# Patient Record
Sex: Female | Born: 1985 | Race: White | Hispanic: No | Marital: Single | Smoking: Never smoker
Health system: Southern US, Community
[De-identification: ages and names within clinical notes are randomized; demographics above are authoritative.]

## PROBLEM LIST (undated history)

## (undated) DIAGNOSIS — N83201 Unspecified ovarian cyst, right side: Secondary | ICD-10-CM

## (undated) DIAGNOSIS — E119 Type 2 diabetes mellitus without complications: Secondary | ICD-10-CM

## (undated) DIAGNOSIS — E282 Polycystic ovarian syndrome: Secondary | ICD-10-CM

## (undated) DIAGNOSIS — E079 Disorder of thyroid, unspecified: Secondary | ICD-10-CM

## (undated) DIAGNOSIS — D219 Benign neoplasm of connective and other soft tissue, unspecified: Secondary | ICD-10-CM

## (undated) DIAGNOSIS — N809 Endometriosis, unspecified: Secondary | ICD-10-CM

## (undated) HISTORY — DX: Unspecified ovarian cyst, right side: N83.201

## (undated) HISTORY — DX: Benign neoplasm of connective and other soft tissue, unspecified: D21.9

## (undated) HISTORY — DX: Type 2 diabetes mellitus without complications: E11.9

## (undated) HISTORY — PX: APPENDECTOMY: SHX54

## (undated) HISTORY — DX: Endometriosis, unspecified: N80.9

## (undated) HISTORY — PX: CHOLECYSTECTOMY: SHX55

## (undated) HISTORY — DX: Disorder of thyroid, unspecified: E07.9

## (undated) HISTORY — PX: LAPAROSCOPIC OVARIAN CYSTECTOMY: SUR786

## (undated) HISTORY — DX: Polycystic ovarian syndrome: E28.2

---

## 2009-05-29 ENCOUNTER — Observation Stay: Payer: Self-pay | Admitting: Unknown Physician Specialty

## 2009-06-09 ENCOUNTER — Inpatient Hospital Stay: Payer: Self-pay

## 2011-06-14 ENCOUNTER — Ambulatory Visit: Payer: Self-pay | Admitting: General Practice

## 2011-06-22 ENCOUNTER — Ambulatory Visit: Payer: Self-pay | Admitting: General Practice

## 2011-09-28 ENCOUNTER — Other Ambulatory Visit: Payer: Self-pay | Admitting: Neurosurgery

## 2011-09-28 ENCOUNTER — Ambulatory Visit
Admission: RE | Admit: 2011-09-28 | Discharge: 2011-09-28 | Disposition: A | Discharge status: Home / Self Care | Payer: PRIVATE HEALTH INSURANCE | Source: Ambulatory Visit | Attending: Neurosurgery | Admitting: Neurosurgery

## 2011-09-28 DIAGNOSIS — D332 Benign neoplasm of brain, unspecified: Secondary | ICD-10-CM

## 2011-09-28 MED ORDER — GADOBENATE DIMEGLUMINE 529 MG/ML IV SOLN
19.0000 mL | Freq: Once | INTRAVENOUS | Status: AC | PRN
  Administered 2011-09-28: 19 mL via INTRAVENOUS

## 2016-11-19 DIAGNOSIS — E119 Type 2 diabetes mellitus without complications: Secondary | ICD-10-CM

## 2020-09-28 ENCOUNTER — Telehealth: Payer: Self-pay

## 2020-09-28 NOTE — Telephone Encounter (Signed)
Patient is scheduled for poss paraguard placement with [facility] on 10/01/20 at 3:10

## 2020-10-01 ENCOUNTER — Other Ambulatory Visit: Payer: Self-pay

## 2020-10-01 ENCOUNTER — Other Ambulatory Visit (HOSPITAL_COMMUNITY)
Admission: RE | Admit: 2020-10-01 | Discharge: 2020-10-01 | Disposition: A | Discharge status: Home / Self Care | Payer: 59 | Source: Ambulatory Visit | Attending: Obstetrics and Gynecology | Admitting: Obstetrics and Gynecology

## 2020-10-01 ENCOUNTER — Encounter: Payer: Self-pay | Admitting: Obstetrics and Gynecology

## 2020-10-01 ENCOUNTER — Ambulatory Visit (INDEPENDENT_AMBULATORY_CARE_PROVIDER_SITE_OTHER): Payer: 59 | Admitting: Obstetrics and Gynecology

## 2020-10-01 VITALS — BP 128/70 | Ht 69.0 in | Wt 215.2 lb

## 2020-10-01 DIAGNOSIS — Z124 Encounter for screening for malignant neoplasm of cervix: Secondary | ICD-10-CM | POA: Insufficient documentation

## 2020-10-01 DIAGNOSIS — Z113 Encounter for screening for infections with a predominantly sexual mode of transmission: Secondary | ICD-10-CM

## 2020-10-01 DIAGNOSIS — Z01419 Encounter for gynecological examination (general) (routine) without abnormal findings: Secondary | ICD-10-CM

## 2020-10-01 DIAGNOSIS — Z30430 Encounter for insertion of intrauterine contraceptive device: Secondary | ICD-10-CM

## 2020-10-01 DIAGNOSIS — Z1231 Encounter for screening mammogram for malignant neoplasm of breast: Secondary | ICD-10-CM | POA: Diagnosis not present

## 2020-10-01 LAB — POCT URINE PREGNANCY: Preg Test, Ur: NEGATIVE

## 2020-10-01 NOTE — Progress Notes (Signed)
Gynecology Annual Exam  PCP: Patient, No Pcp Per  Chief Complaint:  Chief Complaint  Patient presents with  . Gynecologic Exam    History of Present Illness: Patient is a 35 y.o. G2P0 presents for annual exam. The patient has no complaints today.   LMP: Patient's last menstrual period was 09/17/2020. Average Interval: monthly Duration of flow: 4-5 days Heavy Menses: sometimes Intermenstrual Bleeding: no Dysmenorrhea: sometiems    The patient does perform self breast exams.  There is no notable family history of breast or ovarian cancer in her family.  The patient reports her exercise generally consists of walking and HITT .  The patient denies current symptoms of depression.   Review of Systems: ROS  Past Medical History:  Past Medical History:  Diagnosis Date  . Diabetes mellitus without complication ([place])   . Endometriosis   . Fibroid   . Thyroid disease     Past Surgical History:  Past Surgical History:  Procedure Laterality Date  . APPENDECTOMY    . CHOLECYSTECTOMY      Gynecologic History:  Patient's last menstrual period was 09/17/2020. Menarche: 8th grade  History of fibroids, polyps, or ovarian cysts? : Yes, history of ovarian cyst  History of PCOS? has Hstory of Endometriosis? has History of abnormal pap smears? denies Have you had any sexually transmitted infections in the past?  denies    She has had HPV vaccination in the past.    Last Pap: Results were: unknown  She identifies as a female. She is sexually active with men.   She denies dyspareunia. She denies postcoital bleeding.  She currently uses condoms for contraception.    Obstetric History: G2P0  Family History:  History reviewed. No pertinent family history.  Social History:  Social History   Socioeconomic History  . Marital status: Married    Spouse name: Not on file  . Number of children: Not on file  . Years of education: Not on file  . Highest education level:  Not on file  Occupational History  . Not on file  Tobacco Use  . Smoking status: Never Smoker  . Smokeless tobacco: Never Used  Vaping Use  . Vaping Use: Never used  Substance and Sexual Activity  . Alcohol use: Yes    Comment: occ  . Drug use: Not on file  . Sexual activity: Yes  Other Topics Concern  . Not on file  Social History Narrative  . Not on file   Social Determinants of Health   Financial Resource Strain: Not on file  Food Insecurity: Not on file  Transportation Needs: Not on file  Physical Activity: Not on file  Stress: Not on file  Social Connections: Not on file  Intimate Partner Violence: Not on file    Allergies:  No Known Allergies  Medications: Prior to Admission medications   Not on File    Physical Exam Vitals: Blood pressure 128/70, height 5\' 9"  (1.753 m), weight 215 lb 3.2 oz (97.6 kg), last menstrual period 09/17/2020.  Physical Exam Constitutional:      Appearance: She is well-developed.  Genitourinary:     Vagina and uterus normal.     There is no lesion on the right labia.     There is no lesion on the left labia.    No lesions in the vagina.     Genitourinary Comments: External: Normal appearing vulva. No lesions noted.  Speculum examination: Normal appearing cervix. No blood in the vaginal vault. No  discharge.   Bimanual examination: Uterus midline, non-tender, normal in size, shape and contour.  No CMT. No adnexal masses. No adnexal tenderness. Pelvis not fixed.       Right Adnexa: no mass present.    Left Adnexa: no mass present.    No cervical motion tenderness.  Breasts:     Right: No inverted nipple, mass, nipple discharge or skin change.     Left: No inverted nipple, mass, nipple discharge or skin change.    HENT:     Head: Normocephalic and atraumatic.  Eyes:     Extraocular Movements: EOM normal.  Neck:     Thyroid: No thyromegaly.  Cardiovascular:     Rate and Rhythm: Normal rate and regular rhythm.     Heart  sounds: Normal heart sounds.  Pulmonary:     Effort: Pulmonary effort is normal.     Breath sounds: Normal breath sounds.  Abdominal:     General: Bowel sounds are normal. There is no distension.     Palpations: Abdomen is soft. There is no mass.  Musculoskeletal:     Cervical back: Neck supple.  Neurological:     Mental Status: She is alert and oriented to person, place, and time.  Skin:    General: Skin is warm and dry.  Psychiatric:        Mood and Affect: Mood and affect normal.        Behavior: Behavior normal.        Thought Content: Thought content normal.        Judgment: Judgment normal.  Vitals reviewed.      Female chaperone present for pelvic and breast  portions of the physical exam  IUD PROCEDURE NOTE:  HALSEY PERSAUD is a 35 y.o. G2P1002 here for IUD insertion. No GYN concerns.   IUD Insertion Procedure Note Patient identified, informed consent performed, consent signed.   Discussed risks of irregular bleeding, cramping, infection, malpositioning or misplacement of the IUD outside the uterus which may require further procedure such as laparoscopy, risk of failure <1%. Time out was performed.  Urine pregnancy test negative.  A bimanual exam showed the uterus to be anteverted.  Speculum placed in the vagina.  Cervix visualized.  Cleaned with Betadine x 2.  Grasped anteriorly with a single tooth tenaculum.  Uterus sounded to 10 cm.   Paragard IUD placed per manufacturer's recommendations.  Strings trimmed to 3 cm. Tenaculum was removed, good hemostasis noted.  Patient tolerated procedure well.   Patient was given post-procedure instructions.  She was advised to have backup contraception for one week.  Patient was also asked to follow up in 4 weeks for IUD check.  Assessment: 35 y.o. G2P0 routine annual exam  Plan: Problem List Items Addressed This Visit   None   Visit Diagnoses    Encounter for IUD insertion    -  Primary   Relevant Orders   POCT urine pregnancy  (Completed)   Encounter for annual routine gynecological examination       Breast cancer screening by mammogram       Encounter for gynecological examination without abnormal finding       Cervical cancer screening       Relevant Orders   Cytology - PAP   Screen for STD (sexually transmitted disease)       Relevant Orders   HIV antibody (with reflex)   Hepatitis panel, acute   RPR      1) STI screening was  offered and accepted  2) ASCCP guidelines and rational discussed.  Patient opts for every 5 years screening interval  3) Contraception - copper IUD inserted today  4) Routine healthcare maintenance including cholesterol, diabetes screening discussed managed by PCP   Follow up in 4 weeks for IUD check up  Adrian Prows MD, [place], [facility] 10/01/2020 3:49 PM

## 2020-10-01 NOTE — Progress Notes (Signed)
IUD Placement

## 2020-10-01 NOTE — Patient Instructions (Addendum)
Intrauterine Device Information An intrauterine device (IUD) is a medical device that is inserted into the uterus to prevent pregnancy. It is a small, T-shaped device that has one or two nylon strings hanging down from it. The strings hang out of the lower part of the uterus (cervix) to allow for future IUD removal. There are two types of IUDs:  Hormone IUD. This type of IUD is made of plastic and contains the hormone progestin (synthetic progesterone). A hormone IUD may last 3-5 years.  Copper IUD. This type of IUD has copper wire wrapped around it. A copper IUD may last up to 10 years. How is an IUD inserted? An IUD is inserted through the vagina, through the cervix, and into the uterus with a minor medical procedure. The procedure for IUD insertion may vary among health care providers and hospitals. How does an IUD work? Synthetic progesterone in a hormonal IUD prevents pregnancy by:  Thickening cervical mucus to prevent sperm from entering the uterus.  Thinning the uterine lining to prevent a fertilized egg from being implanted there. Copper in a copper IUD prevents pregnancy by making the uterus and fallopian tubes produce a fluid that kills sperm. What are the advantages of an IUD? Advantages of either type of IUD An IUD:  Is highly effective in preventing pregnancy.  Is reversible. You can become pregnant shortly after the IUD is removed.  Is low-maintenance and can stay in place for a long time.  Has no estrogen-related side effects.  Can be used when breastfeeding.  Is not associated with weight gain.  Can be inserted right after childbirth, an abortion, or a miscarriage. Advantages of a hormone IUD  If it is inserted within 7 days of your period starting, it works right after it has been inserted. If the hormone IUD is inserted at any other time in your cycle, you will need to use a backup method of birth control for 7 days after insertion.  It can make menstrual periods  lighter or stop completely.  It can reduce menstrual cramping and other discomforts from menstrual periods.  It can be used for 3-5 years, depending on which IUD you have. Advantages of a copper IUD  It works right after it is inserted.  It can be used as a form of emergency birth control if it is inserted within 5 days after having unprotected sex.  It does not interfere with your body's natural hormones.  It can be used for up to 10 years. What are the disadvantages of an IUD?  An IUD may cause irregular menstrual bleeding for a period of time after insertion.  It is common to have pain during insertion and have cramping and vaginal bleeding after insertion.  An IUD may cut the uterus (uterine perforation) when it is inserted. This is rare.  Pelvic inflammatory disease (PID) may happen after insertion of an IUD. PID is an infection in the uterus and fallopian tubes. The IUD does not cause the infection. The infection is usually from an unknown sexually transmitted infection (STI). This is rare, and it usually happens during the first 20 days after the IUD is inserted.  A copper IUD can make your menstrual flow heavier and more painful.  IUDs cannot prevent sexually transmitted infections (STIs). How is an IUD removed?   You will lie on your back with your knees bent and your feet in footrests (stirrups).  A device will be inserted into your vagina to spread apart the vaginal walls (  speculum). This will allow your health care provider to see the strings attached to the IUD.  Your health care provider will use a small instrument (forceps) to grasp the IUD strings and will pull firmly until the IUD is removed. You may have some discomfort when the IUD is removed. Your health care provider may recommend taking over-the-counter pain relievers, such as ibuprofen, before the procedure. You may also have minor spotting for a few days after the procedure. The procedure for IUD removal may  vary among health care providers and hospitals. Is an IUD right for me? If you are interested in an IUD, discuss it with your health care provider. He or she will make sure you are a good candidate for an IUD and will let you know more about the advantages, disadvantage, and possible side effects. This will allow you to make a decision about the device. Summary  An intrauterine device (IUD) is a medical device that is inserted in the uterus to prevent pregnancy. It is a small, T-shaped device that has one or two nylon strings hanging down from it.  A hormone IUD contains the hormone progestin (synthetic progesterone). A copper IUD has copper wire wrapped around it.  Synthetic progesterone in a hormone IUD prevents pregnancy by thickening cervical mucus and thinning the walls of the uterus. Copper in a copper IUD prevents pregnancy by making the uterus and fallopian tubes produce a fluid that kills sperm.  A hormone IUD can be left in place for 3-5 years. A copper IUD can be left in place for up to 10 years.  An IUD is inserted and removed by a health care provider. You may feel some pain during insertion and removal. Your health care provider may recommend taking over-the-counter pain medicine, such as ibuprofen, before an IUD procedure. This information is not intended to replace advice given to you by your health care provider. Make sure you discuss any questions you have with your health care provider. Document Revised: 03/03/2020 Document Reviewed: 03/03/2020 Elsevier Patient Education  2021 [place] for Calcium and Vitamin D  Age (yr) Calcium Recommended Dietary Allowance (mg/day) Vitamin D Recommended Dietary Allowance (international units/day)  9-18 1,300 600  19-50 1,000 600  51-70 1,200 600  71 and older 1,200 800  Data from [place] [facility]. Dietary reference intakes: calcium, vitamin D. [place], [place]:  [employer]; 2011.    Exercising to Stay Healthy To become healthy and stay healthy, it is recommended that you do moderate-intensity and vigorous-intensity exercise. You can tell that you are exercising at a moderate intensity if your heart starts beating faster and you start breathing faster but can still hold a conversation. You can tell that you are exercising at a vigorous intensity if you are breathing much harder and faster and cannot hold a conversation while exercising. Exercising regularly is important. It has many health benefits, such as:  Improving overall fitness, flexibility, and endurance.  Increasing bone density.  Helping with weight control.  Decreasing body fat.  Increasing muscle strength.  Reducing stress and tension.  Improving overall health. How often should I exercise? Choose an activity that you enjoy, and set realistic goals. Your health care provider can help you make an activity plan that works for you. Exercise regularly as told by your health care provider. This may include:  Doing strength training two times a week, such as: ? Lifting weights. ?  Using resistance bands. ? Push-ups. ? Sit-ups. ? Yoga.  Doing a certain intensity of exercise for a given amount of time. Choose from these options: ? A total of 150 minutes of moderate-intensity exercise every week. ? A total of 75 minutes of vigorous-intensity exercise every week. ? A mix of moderate-intensity and vigorous-intensity exercise every week. Children, pregnant women, people who have not exercised regularly, people who are overweight, and older adults may need to talk with a health care provider about what activities are safe to do. If you have a medical condition, be sure to talk with your health care provider before you start a new exercise program. What are some exercise ideas? Moderate-intensity exercise ideas include:  Walking 1 mile (1.6 km) in about 15  minutes.  Biking.  Hiking.  Golfing.  Dancing.  Water aerobics. Vigorous-intensity exercise ideas include:  Walking 4.5 miles (7.2 km) or more in about 1 hour.  Jogging or running 5 miles (8 km) in about 1 hour.  Biking 10 miles (16.1 km) or more in about 1 hour.  Lap swimming.  Roller-skating or in-line skating.  Cross-country skiing.  Vigorous competitive sports, such as football, basketball, and soccer.  Jumping rope.  Aerobic dancing.   What are some everyday activities that can help me to get exercise?  [place] work, such as: ? Pushing a Conservation officer, nature. ? Raking and bagging leaves.  Washing your car.  Pushing a stroller.  Shoveling snow.  Gardening.  Washing windows or floors. How can I be more active in my day-to-day activities?  Use stairs instead of an elevator.  Take a walk during your lunch break.  If you drive, park your car farther away from your work or school.  If you take public transportation, get off one stop early and walk the rest of the way.  Stand up or walk around during all of your indoor phone calls.  Get up, stretch, and walk around every 30 minutes throughout the day.  Enjoy exercise with a friend. Support to continue exercising will help you keep a regular routine of activity. What guidelines can I follow while exercising?  Before you start a new exercise program, talk with your health care provider.  Do not exercise so much that you hurt yourself, feel dizzy, or get very short of breath.  Wear comfortable clothes and wear shoes with good support.  Drink plenty of water while you exercise to prevent dehydration or heat stroke.  Work out until your breathing and your heartbeat get faster. Where to find more information  U.S. Department of Health and Human Services: Bonded[employer].at  Centers for Disease Control and Prevention (CDC): http://www.wolf.info/ Summary  Exercising regularly is important. It will improve your overall fitness,  flexibility, and endurance.  Regular exercise also will improve your overall health. It can help you control your weight, reduce stress, and improve your bone density.  Do not exercise so much that you hurt yourself, feel dizzy, or get very short of breath.  Before you start a new exercise program, talk with your health care provider. This information is not intended to replace advice given to you by your health care provider. Make sure you discuss any questions you have with your health care provider. Document Revised: 08/03/2017 Document Reviewed: 07/12/2017 Elsevier Patient Education  2021 [place].   Budget-Friendly Healthy Eating There are many ways to save money at the grocery store and continue to eat healthy. You can be successful if you:  Plan meals according  to your budget.  Make a grocery list and only purchase food according to your grocery list.  Prepare food yourself at home. What are tips for following this plan? Reading food labels  Compare food labels between brand name foods and the store brand. Often the nutritional value is the same, but the store brand is lower cost.  Look for products that do not have added sugar, fat, or salt (sodium). These often cost the same but are healthier for you. Products may be labeled as: ? Sugar-free. ? Nonfat. ? Low-fat. ? Sodium-free. ? Low-sodium.  Look for lean ground beef labeled as at least 92% lean and 8% fat. Shopping  Buy only the items on your grocery list and go only to the areas of the store that have the items on your list.  Use coupons only for foods and brands you normally buy. Avoid buying items you wouldn't normally buy simply because they are on sale.  Check online and in newspapers for weekly deals.  Buy healthy items from the bulk bins when available, such as herbs, spices, flour, pasta, nuts, and dried fruit.  Buy fruits and vegetables that are in season. Prices are usually lower on in-season  produce.  Look at the unit price on the price tag. Use it to compare different brands and sizes to find out which item is the best deal.  Choose healthy items that are often low-cost, such as carrots, potatoes, apples, bananas, and oranges. Dried or canned beans are a low-cost protein source.  Buy in bulk and freeze extra food. Items you can buy in bulk include meats, fish, poultry, frozen fruits, and frozen vegetables.  Avoid buying "ready-to-eat" foods, such as pre-cut fruits and vegetables and pre-made salads.  If possible, shop around to discover where you can find the best prices. Consider other retailers such as dollar stores, larger [employer], local fruit and vegetable stands, and farmers markets.  Do not shop when you are hungry. If you shop while hungry, it may be hard to stick to your list and budget.  Resist impulse buying. Use your grocery list as your official plan for the week.  Buy a variety of vegetables and fruits by purchasing fresh, frozen, and canned items.  Look at the top and bottom shelves for deals. Foods at eye level (eye level of an adult or child) are usually more expensive.  Be efficient with your time when shopping. The more time you spend at the store, the more money you are likely to spend.  To save money when choosing more expensive foods like meats and dairy: ? Choose cheaper cuts of meat, such as bone-in chicken thighs and drumsticks instead of skinless and boneless chicken. When you are ready to prepare the chicken, you can remove the skin yourself to make it healthier. ? Choose lean meats like chicken or [place] instead of beef. ? Choose canned seafood, such as tuna, salmon, or sardines. ? Buy eggs as a low-cost source of protein. ? Buy dried beans and peas, such as lentils, split peas, or kidney beans instead of meats. Dried beans and peas are a good alternative source of protein. ? Buy the larger tubs of yogurt instead of individual-sized  containers.  Choose water instead of sodas and other sweetened beverages.  Avoid buying chips, cookies, and other "junk food." These items are usually expensive and not healthy.   Cooking  Make extra food and freeze the extras in meal-sized containers or in individual portions for fast  meals and snacks.  Pre-cook on days when you have extra time to prepare meals in advance. You can keep these meals in the fridge or freezer and reheat for a quick meal.  When you come home from the grocery store, wash, peel, and cut fruits and vegetables so they are ready to use and eat. This will help reduce food waste. Meal planning  Do not eat out or get fast food. Prepare food at home.  Make a grocery list and make sure to bring it with you to the store. If you have a smart phone, you could use your phone to create your shopping list.  Plan meals and snacks according to a grocery list and budget you create.  Use leftovers in your meal plan for the week.  Look for recipes where you can cook once and make enough food for two meals.  Prepare budget-friendly types of meals like stews, casseroles, and stir-fry dishes.  Try some meatless meals or try "no cook" meals like salads.  Make sure that half your plate is filled with fruits or vegetables. Choose from fresh, frozen, or canned fruits and vegetables. If eating canned, remember to rinse them before eating. This will remove any excess salt added for packaging. Summary  Eating healthy on a budget is possible if you plan your meals according to your budget, purchase according to your budget and grocery list, and prepare food yourself.  Tips for buying more food on a limited budget include buying generic brands, using coupons only for foods you normally buy, and buying healthy items from the bulk bins when available.  Tips for buying cheaper food to replace expensive food include choosing cheaper, lean cuts of meat, and buying dried beans and  peas. This information is not intended to replace advice given to you by your health care provider. Make sure you discuss any questions you have with your health care provider. Document Revised: 06/03/2020 Document Reviewed: 06/03/2020 Elsevier Patient Education  [place] protect organs, store calcium, anchor muscles, and support the whole body. Keeping your bones strong is important, especially as you get older. You can take actions to help keep your bones strong and healthy. Why is keeping my bones healthy important? Keeping your bones healthy is important because your body constantly replaces bone cells. Cells get old, and new cells take their place. As we age, we lose bone cells because the body may not be able to make enough new cells to replace the old cells. The amount of bone cells and bone tissue you have is referred to as bone mass. The higher your bone mass, the stronger your bones. The aging process leads to an overall loss of bone mass in the body, which can increase the likelihood of:  Joint pain and stiffness.  Broken bones.  A condition in which the bones become weak and brittle (osteoporosis). A large decline in bone mass occurs in older adults. In women, it occurs about the time of menopause.   What actions can I take to keep my bones healthy? Good health habits are important for maintaining healthy bones. This includes eating nutritious foods and exercising regularly. To have healthy bones, you need to get enough of the right minerals and vitamins. Most nutrition experts recommend getting these nutrients from the foods that you eat. In some cases, taking supplements may also be recommended. Doing certain types of exercise is also important for bone health. What are the nutritional recommendations  for healthy bones? Eating a well-balanced diet with plenty of calcium and vitamin D will help to protect your bones. Nutritional recommendations vary  from person to person. Ask your health care provider what is healthy for you. Here are some general guidelines. Get enough calcium Calcium is the most important (essential) mineral for bone health. Most people can get enough calcium from their diet, but supplements may be recommended for people who are at risk for osteoporosis. Good sources of calcium include:  Dairy products, such as low-fat or nonfat milk, cheese, and yogurt.  Dark green leafy vegetables, such as bok choy and broccoli.  Calcium-fortified foods, such as orange juice, cereal, bread, soy beverages, and tofu products.  Nuts, such as almonds. Follow these recommended amounts for daily calcium intake:  Children, age 31-3: 700 mg.  Children, age 33-8: 1,000 mg.  Children, age 82-13: 1,300 mg.  Teens, age 22-18: 1,300 mg.  Adults, age 78-50: 1,000 mg.  Adults, age 24-70: ? Men: 1,000 mg. ? Women: 1,200 mg.  Adults, age 332 or older: 1,200 mg.  Pregnant and breastfeeding females: ? Teens: 1,300 mg. ? Adults: 1,000 mg. Get enough vitamin D Vitamin D is the most essential vitamin for bone health. It helps the body absorb calcium. Sunlight stimulates the skin to make vitamin D, so be sure to get enough sunlight. If you live in a cold climate or you do not get outside often, your health care provider may recommend that you take vitamin D supplements. Good sources of vitamin D in your diet include:  Egg yolks.  Saltwater fish.  Milk and cereal fortified with vitamin D. Follow these recommended amounts for daily vitamin D intake:  Children and teens, age 31-18: 600 international units.  Adults, age 66 or younger: 400-800 international units.  Adults, age 73 or older: 800-1,000 international units. Get other important nutrients Other nutrients that are important for bone health include:  Phosphorus. This mineral is found in meat, poultry, dairy foods, nuts, and legumes. The recommended daily intake for adult men and  adult women is 700 mg.  Magnesium. This mineral is found in seeds, nuts, dark green vegetables, and legumes. The recommended daily intake for adult men is 400-420 mg. For adult women, it is 310-320 mg.  Vitamin K. This vitamin is found in green leafy vegetables. The recommended daily intake is 120 mg for adult men and 90 mg for adult women.   What type of physical activity is best for building and maintaining healthy bones? Weight-bearing and strength-building activities are important for building and maintaining healthy bones. Weight-bearing activities cause muscles and bones to work against gravity. Strength-building activities increase the strength of the muscles that support bones. Weight-bearing and muscle-building activities include:  Walking and hiking.  Jogging and running.  Dancing.  Gym exercises.  Lifting weights.  Tennis and racquetball.  Climbing stairs.  Aerobics. Adults should get at least 30 minutes of moderate physical activity on most days. Children should get at least 60 minutes of moderate physical activity on most days. Ask your health care provider what type of exercise is best for you.   How can I find out if my bone mass is low? Bone mass can be measured with an X-ray test called a bone mineral density (BMD) test. This test is recommended for all women who are age 71 or older. It may also be recommended for:  Men who are age 32 or older.  People who are at risk for osteoporosis because of: ?  Having bones that break easily. ? Having a long-term disease that weakens bones, such as kidney disease or rheumatoid arthritis. ? Having menopause earlier than normal. ? Taking medicine that weakens bones, such as steroids, thyroid hormones, or hormone treatment for breast cancer or prostate cancer. ? Smoking. ? Drinking three or more alcoholic drinks a day. If you find that you have a low bone mass, you may be able to prevent osteoporosis or further bone loss by  changing your diet and lifestyle. Where can I find more information? For more information, check out the following websites:  [place]: AviationTales.fr  [employer] of Health: www.bones.SouthExposed.es  International Osteoporosis Foundation: Administrator.iofbonehealth.org Summary  The aging process leads to an overall loss of bone mass in the body, which can increase the likelihood of broken bones and osteoporosis.  Eating a well-balanced diet with plenty of calcium and vitamin D will help to protect your bones.  Weight-bearing and strength-building activities are also important for building and maintaining strong bones.  Bone mass can be measured with an X-ray test called a bone mineral density (BMD) test. This information is not intended to replace advice given to you by your health care provider. Make sure you discuss any questions you have with your health care provider. Document Revised: 09/17/2017 Document Reviewed: 09/17/2017 Elsevier Patient Education  2021 [employer].

## 2020-10-02 LAB — HEPATITIS PANEL, ACUTE
Hep A IgM: NEGATIVE
Hep B C IgM: NEGATIVE
Hep C Virus Ab: <0.1 s/co ratio (ref 0.0–0.9)
Hepatitis B Surface Ag: NEGATIVE

## 2020-10-02 LAB — RPR: RPR Ser Ql: NONREACTIVE

## 2020-10-02 LAB — HIV ANTIBODY (ROUTINE TESTING W REFLEX): HIV Screen 4th Generation wRfx: NONREACTIVE

## 2020-10-07 LAB — CYTOLOGY - PAP
Chlamydia: NEGATIVE
Comment: NEGATIVE
Comment: NEGATIVE
Comment: NEGATIVE
Comment: NORMAL
Diagnosis: UNDETERMINED — AB
High risk HPV: NEGATIVE
Neisseria Gonorrhea: NEGATIVE
Trichomonas: NEGATIVE

## 2020-10-07 NOTE — Telephone Encounter (Signed)
Paragard rcvd/charged 10/01/20

## 2020-10-26 ENCOUNTER — Telehealth: Payer: Self-pay

## 2020-10-26 NOTE — Telephone Encounter (Signed)
Pt calling; paragard was put in last month; has been bleeding quit a bit since Sunday; is taking iron b/c she isn't used to so much blood and clots.  [phone] Pt states she had some spotting with insertion; this is time for her period; states she is NOT saturating a pad every 42min-1hr.  Adv it takes a good three months for body to adjust to bc.  To give it some time; may continue iron if desires.

## 2021-04-20 ENCOUNTER — Telehealth: Payer: Self-pay

## 2021-04-20 ENCOUNTER — Ambulatory Visit (INDEPENDENT_AMBULATORY_CARE_PROVIDER_SITE_OTHER): Payer: 59

## 2021-04-20 ENCOUNTER — Other Ambulatory Visit: Payer: Self-pay

## 2021-04-20 DIAGNOSIS — R300 Dysuria: Secondary | ICD-10-CM | POA: Diagnosis not present

## 2021-04-20 LAB — POCT URINALYSIS DIPSTICK
Bilirubin, UA: NEGATIVE
Blood, UA: POSITIVE
Glucose, UA: NEGATIVE
Ketones, UA: NEGATIVE
Leukocytes, UA: NEGATIVE
Nitrite, UA: NEGATIVE
Protein, UA: NEGATIVE
Spec Grav, UA: 1.01 (ref 1.010–1.025)
Urobilinogen, UA: 0.2 E.U./dL
pH, UA: 5 (ref 5.0–8.0)

## 2021-04-20 NOTE — Telephone Encounter (Signed)
Patient is scheduled for 4:20 for UA drop

## 2021-04-20 NOTE — Telephone Encounter (Signed)
Please schedule her for a urine drop off nurse visit today. If UA is positive I will send a prescription.  Please schedule FUTURE office visit for IUD exchange if patient desires.   Thank you

## 2021-04-20 NOTE — Telephone Encounter (Signed)
Pt calling; had paragard inserted back in Jan b/c it was the only one available; her periods are heavier; the cramping is crazy; has PCOS and endometriosis; not sure if they could be contributing factors; had mirena before with no periods and never had these issues.  Take paragard and put in mirena?  Also, with her last period she had UTI sxs of back pain, frequency, painful urination; chills and body aches; she is having those sxs again with this period; can rx be sent in?  [phone]

## 2021-04-20 NOTE — Telephone Encounter (Signed)
Patient is requesting paraguard Removal and mirena placement with [facility] 05/04/21 at 3:30

## 2021-04-21 ENCOUNTER — Telehealth: Payer: Self-pay

## 2021-04-21 NOTE — Telephone Encounter (Signed)
Pt calling; dropped off urine yesterday; has it been tested yet?  [phone]  Pt aware specimen was sent for a culture; needs to grow for 48 hours; may be late tomorrow or Sat before we get the results; in meantime she can take AZO standard otc, drink cranberry juice if too sweet can mix half and half with ginger ale.

## 2021-04-22 LAB — URINE CULTURE: Organism ID, Bacteria: NO GROWTH

## 2021-05-04 ENCOUNTER — Other Ambulatory Visit: Payer: Self-pay

## 2021-05-04 ENCOUNTER — Ambulatory Visit (INDEPENDENT_AMBULATORY_CARE_PROVIDER_SITE_OTHER): Payer: 59 | Admitting: Obstetrics and Gynecology

## 2021-05-04 ENCOUNTER — Encounter: Payer: Self-pay | Admitting: Obstetrics and Gynecology

## 2021-05-04 VITALS — BP 110/70 | Ht 69.0 in | Wt 219.0 lb

## 2021-05-04 DIAGNOSIS — Z30433 Encounter for removal and reinsertion of intrauterine contraceptive device: Secondary | ICD-10-CM

## 2021-05-04 DIAGNOSIS — N946 Dysmenorrhea, unspecified: Secondary | ICD-10-CM

## 2021-05-04 MED ORDER — IBUPROFEN 800 MG PO TABS: 800.0000 mg | ORAL_TABLET | Freq: Three times a day (TID) | ORAL | 2 refills | Status: DC | PRN

## 2021-05-04 MED ORDER — LEVONORGESTREL 20 MCG/DAY IU IUD: 1.0000 | INTRAUTERINE_SYSTEM | Freq: Once | INTRAUTERINE | 0 refills | Status: AC

## 2021-05-04 NOTE — Progress Notes (Signed)
Chief Complaint  Patient presents with   Menorrhagia    Severe cramping since IUD insertion, having cycles every 15-20 days     History of Present Illness:  Diamond Hernandez is a 35 y.o. that had a Paragard IUD placed approximately 7 months ago. Since that time, she has had bleeding Q2-3 wks, lasting 6-7 days, heavy flow, with large clots and severe dysmen, not improved with ibup. Dysmen starts a few days before bleeding and similar to labor pains. Would like RF of ibup 800 mg. Hx of PCOS, ovar cysts, endometriosis. Had Mirena in past with amenorrhea and minimal dysmen. Would like another one. Only reason she got Paragard at 1/22 appt was that it was only one in stock that day. She is sex active, no pain/bleeding.  ASCUS/neg pap 1/22.   Past Medical History:  Diagnosis Date   Bilateral ovarian cysts    Diabetes mellitus without complication (HCC)    Endometriosis    Fibroid    PCOS (polycystic ovarian syndrome)    Thyroid disease    Past Surgical History:  Procedure Laterality Date   APPENDECTOMY     CHOLECYSTECTOMY     LAPAROSCOPIC OVARIAN CYSTECTOMY       BP 110/70   Ht '5\' 9"'$  (1.753 m)   Wt 219 lb (99.3 kg)   LMP 04/18/2021 (Exact Date)   BMI 32.34 kg/m   Pelvic exam:  Two IUD strings present seen coming from the cervical os. EGBUS, vaginal vault and cervix: within normal limits  IUD Removal Strings of IUD identified and grasped.  IUD removed without problem with ring forceps.  Pt tolerated this well.  IUD noted to be intact.   IUD Insertion Procedure Note Patient identified, informed consent performed, consent signed.   Discussed risks of irregular bleeding, cramping, infection, malpositioning or misplacement of the IUD outside the uterus which may require further procedure such as laparoscopy, risk of failure <1%. Time out was performed.    Speculum placed in the vagina.  Cervix visualized.  Cleaned with Betadine x 2.  Grasped anteriorly with a single tooth tenaculum.   Uterus sounded to 11.0 cm.   IUD placed per manufacturer's recommendations.  Strings trimmed to 3 cm. Tenaculum was removed, good hemostasis noted.  Patient tolerated procedure well.   ASSESSMENT:  Encounter for removal and reinsertion of intrauterine contraceptive device (IUD) - Plan: levonorgestrel (MIRENA) 20 MCG/DAY IUD  Dysmenorrhea - Plan: ibuprofen (ADVIL) 800 MG tablet.; Rx RF. Try Mirena. Will check GYN u/s if sx persist.   Hx of endometriosis, PCOS, ovar cysts.    Meds ordered this encounter  Medications   ibuprofen (ADVIL) 800 MG tablet    Sig: Take 1 tablet (800 mg total) by mouth 3 (three) times daily as needed.    Dispense:  30 tablet    Refill:  2    Order Specific Question:   Supervising Provider    Answer:   Gae Dry [id number]   levonorgestrel (MIRENA) 20 MCG/DAY IUD    Sig: 1 each by Intrauterine route once for 1 dose.    Dispense:  1 each    Refill:  0    Order Specific Question:   Supervising Provider    Answer:   Gae Dry [id number]     Plan:  Patient was given post-procedure instructions.  She was advised to have backup contraception for one week.   Call if you are having increasing pain, cramps or bleeding or if you have a  fever greater than 100.4 degrees F., shaking chills, nausea or vomiting. Patient was also asked to check IUD strings periodically and follow up in 4 weeks for IUD check.  Return in about 4 weeks (around 06/01/2021) for IUD f/u.  Meiko Stranahan B. Ladavia Lindenbaum, PA-C 05/04/2021 4:01 PM

## 2021-05-04 NOTE — Patient Instructions (Signed)
I value your feedback and you entrusting us with your care. If you get a [facility] patient survey, I would appreciate you taking the time to let us know about your experience today. Thank you!  [facility] [phone]  Instructions after IUD insertion  Most women experience no significant problems after insertion of an IUD, however minor cramping and spotting for a few days is common. Cramps may be treated with ibuprofen 800mg every 8 hours or Tylenol 650 mg every 4 hours. Contact [facility] immediately if you experience any of the following symptoms during the next week: temperature >99.6 degrees, worsening pelvic pain, abdominal pain, fainting, unusually heavy vaginal bleeding, foul vaginal discharge, or if you think you have expelled the IUD.  Nothing inserted in the vagina for 48 hours. You will be scheduled for a follow up visit in approximately four weeks.  You should check monthly to be sure you can feel the IUD strings in the upper vagina. If you are having a monthly period, try to check after each period. If you cannot feel the IUD strings,  contact [facility] immediately so we can do an exam to determine if the IUD has been expelled.   Please use backup protection until we can confirm the IUD is in place.  Call [facility] if you are exposed to or diagnosed with a sexually transmitted infection, as we will need to discuss whether it is safe for you to continue using an IUD.   

## 2021-05-05 NOTE — Telephone Encounter (Signed)
Noted. Mirena rcvd/charged 05/04/21

## 2021-05-31 NOTE — Progress Notes (Deleted)
   No chief complaint on file.    History of Present Illness:  Diamond Hernandez is a 35 y.o. that had a Mirena IUD placed approximately 1 month ago. Since that time, she denies dyspareunia, pelvic pain, non-menstrual bleeding, vaginal d/c, heavy bleeding.  Hx of PCOS, ovar cysts, endometriosis. Had Mirena in past with amenorrhea and minimal dysmen.    Review of Systems  Physical Exam:  There were no vitals taken for this visit. There is no height or weight on file to calculate BMI.  Pelvic exam:  Two IUD strings {DESC; PRESENT/ABSENT:17923} seen coming from the cervical os. EGBUS, vaginal vault and cervix: within normal limits   Assessment:   No diagnosis found.  IUD strings present in proper location; pt doing well  Plan: F/u if any signs of infection or can no longer feel the strings.   Tashaya Ancrum B. Jawanza Zambito, PA-C 05/31/2021 3:32 PM

## 2021-06-01 ENCOUNTER — Ambulatory Visit: Payer: 59 | Admitting: Obstetrics and Gynecology

## 2021-12-22 ENCOUNTER — Other Ambulatory Visit: Payer: Self-pay | Admitting: Nurse Practitioner

## 2021-12-22 DIAGNOSIS — K219 Gastro-esophageal reflux disease without esophagitis: Secondary | ICD-10-CM

## 2021-12-22 DIAGNOSIS — R140 Abdominal distension (gaseous): Secondary | ICD-10-CM

## 2022-01-06 ENCOUNTER — Ambulatory Visit
Admission: RE | Admit: 2022-01-06 | Discharge: 2022-01-06 | Disposition: A | Discharge status: Home / Self Care | Payer: 59 | Source: Ambulatory Visit | Attending: Nurse Practitioner | Admitting: Nurse Practitioner

## 2022-01-06 DIAGNOSIS — R140 Abdominal distension (gaseous): Secondary | ICD-10-CM | POA: Diagnosis present

## 2022-01-06 DIAGNOSIS — K219 Gastro-esophageal reflux disease without esophagitis: Secondary | ICD-10-CM | POA: Insufficient documentation

## 2022-01-06 IMAGING — NM NM GASTRIC EMPTYING
5 series · 16 of 16 positions shown · non-contrast
Comparison: None Available.

CLINICAL DATA: Gastroesophageal reflux.

EXAM:
NUCLEAR MEDICINE GASTRIC EMPTYING SCAN
TECHNIQUE: After oral ingestion of radiolabeled meal, sequential abdominal
images were obtained for 4 hours. Percentage of activity emptying
the stomach was calculated at 1 hour, 2 hour, 3 hour, and 4 hours.
RADIOPHARMACEUTICALS:  2.63 mCi [R3] sulfur colloid in
standardized meal

[Series 1000: gastric statics · 3.90mm/px · 2 of 2 frames shown (1 of 4)]
[frame 1/2]
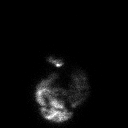
[frame 2/2]
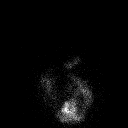

[Series 1000: gastric statics · 3.90mm/px · 2 of 2 frames shown (2 of 4)]
[frame 1/2]
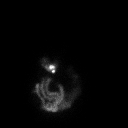
[frame 2/2]
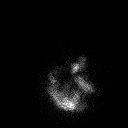

[Series 1000: gastric statics · 3.90mm/px · 2 of 2 frames shown (3 of 4)]
[frame 1/2]
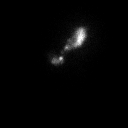
[frame 2/2]
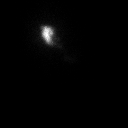

[Series 1000: gastric statics · 3.90mm/px · 2 of 2 frames shown (4 of 4)]
[frame 1/2]
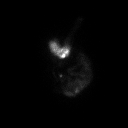
[frame 2/2]
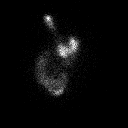

[Series 1000: gastric statics (results) · 3.90mm/px · 4 acquisitions, 8 frames shown]
[im 1/4]
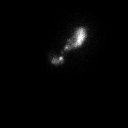
[im 1/4]
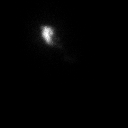
[im 2/4]
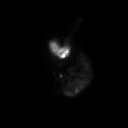
[im 2/4]
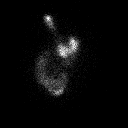
[im 3/4]
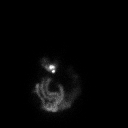
[im 3/4]
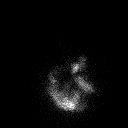
[im 4/4]
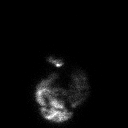
[im 4/4]
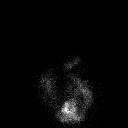

[16 of 16 positions shown; findings below may reference images not displayed]

FINDINGS: Expected location of the stomach in the left upper quadrant.
Ingested meal empties the stomach gradually over the course of the
study.

46% emptied at 1 hr ( normal >= 10%)

81% emptied at 2 hr ( normal >= 40%)

93% emptied at 3 hr ( normal >= 70%)
IMPRESSION: Normal gastric emptying study.

## 2022-01-06 MED ORDER — TECHNETIUM TC 99M SULFUR COLLOID
2.0000 | Freq: Once | INTRAVENOUS | Status: AC
  Administered 2022-01-06: 2.63 via ORAL

## 2022-03-21 ENCOUNTER — Encounter: Payer: Self-pay | Admitting: Family Medicine

## 2022-03-21 ENCOUNTER — Ambulatory Visit: Payer: 59 | Admitting: Family Medicine

## 2022-03-21 VITALS — BP 118/72 | HR 98 | Ht 69.0 in | Wt 221.0 lb

## 2022-03-21 DIAGNOSIS — Z8742 Personal history of other diseases of the female genital tract: Secondary | ICD-10-CM

## 2022-03-21 DIAGNOSIS — Z975 Presence of (intrauterine) contraceptive device: Secondary | ICD-10-CM | POA: Diagnosis not present

## 2022-03-21 DIAGNOSIS — N939 Abnormal uterine and vaginal bleeding, unspecified: Secondary | ICD-10-CM | POA: Diagnosis not present

## 2022-03-21 DIAGNOSIS — E282 Polycystic ovarian syndrome: Secondary | ICD-10-CM

## 2022-03-21 NOTE — Progress Notes (Signed)
   GYNECOLOGY PROBLEM  VISIT ENCOUNTER NOTE  Subjective:   Diamond Hernandez is a 36 y.o. G31P2002 female here for a problem GYN visit.  Current complaints:  Chief Complaint  Patient presents with   Gynecologic Exam    Check Mirena Placement? Had Paragard in past.  Has Mirena placed 04/2021. Has hashimotos,h/0 PCOS, Endometriosis, Ovarian Cyst. Still has spotting w/IUD getting progressively worse. Left side pelvic pain. Feels like when she has had cyst before. Requesting labs.   Reviewed TSH from [facility] system that was WNL in Feb Started Ozempic and has reports improved glucose control on that Feels right sided pain similar to prior ovarian cysts.   Had prior cysts removal on Right and also reports endometriosis on right.   Denies abnormal vaginal bleeding, discharge, pelvic pain, problems with intercourse or other gynecologic concerns.    Gynecologic History No LMP recorded. (Menstrual status: IUD).  Contraception: IUD  Health Maintenance Due  Topic Date Due   URINE MICROALBUMIN  Never done   TETANUS/TDAP  Never done   COVID-19 Vaccine (3 - Pfizer series) 02/02/2020    The following portions of the patient's history were reviewed and updated as appropriate: allergies, current medications, past family history, past medical history, past social history, past surgical history and problem list.  Review of Systems Pertinent items are noted in HPI.   Objective:  BP 118/72 (Patient Position: Sitting, Cuff Size: Normal)   Pulse 98   Ht '5\' 9"'$  (1.753 m)   Wt 221 lb (100.2 kg)   BMI 32.64 kg/m  Gen: well appearing, NAD HEENT: no scleral icterus CV: RR Lung: Normal WOB Ext: warm well perfused  PELVIC: Normal appearing external genitalia; normal appearing vaginal mucosa and cervix.  No abnormal discharge noted.  IUD strings seen.  Normal uterine size, no uterine tenderness. + left adnexal tenderness and fullness. No right adnexal fullness or pain.    Assessment and Plan:   1. Vaginal  bleeding - US PELVIC COMPLETE WITH TRANSVAGINAL; Future  2. Abnormal uterine bleeding (AUB) Likely related to IUD  Consider continuous OCP dosing for 3 months if [place] is WNL Pain and adnexal tenderness likely unrelated to AUB.  - US PELVIC COMPLETE WITH TRANSVAGINAL; Future  3. IUD (intrauterine device) in place LNG IUD  4. PCOS (polycystic ovarian syndrome) Patient reports being due to labs, I have reviewed her chart and it looks like PCP has done more lab but patient has not had PCOS monitoring labs recently Attempted to review prior labs that were monitored but they are not in [place].  Patient would benefit from testosterone level testing.  - TestT+TestF+SHBG; Future  5. H/O Endometriosis - Patient would benefit from discussion about management of endometriosis after [place] results - Could consider GnRH vs Orlissa therapy  Please refer to After Visit Summary for other counseling recommendations.   Return in about 4 weeks (around 04/18/2022) for Justice Britain MD to disucss [place] and possible surgery.  Future Appointments  Date Time Provider [place]  03/23/2022 10:20 AM [facility] None     Caren Macadam, MD, MPH, ABFM Attending [place] for [facility]

## 2022-03-23 ENCOUNTER — Other Ambulatory Visit: Payer: 59

## 2022-03-23 DIAGNOSIS — E282 Polycystic ovarian syndrome: Secondary | ICD-10-CM

## 2022-03-29 LAB — TESTT+TESTF+SHBG
Sex Hormone Binding: 56.8 nmol/L (ref 24.6–122.0)
Testosterone, Free: 0.6 pg/mL (ref 0.0–4.2)
Testosterone, Total, LC/MS: 16.1 ng/dL (ref 10.0–55.0)

## 2022-05-27 ENCOUNTER — Other Ambulatory Visit: Payer: Self-pay | Admitting: Obstetrics and Gynecology

## 2022-05-27 DIAGNOSIS — N946 Dysmenorrhea, unspecified: Secondary | ICD-10-CM

## 2022-06-23 ENCOUNTER — Ambulatory Visit
Admission: RE | Admit: 2022-06-23 | Discharge: 2022-06-23 | Disposition: A | Discharge status: Home / Self Care | Payer: 59 | Source: Ambulatory Visit | Attending: Orthopedic Surgery | Admitting: Orthopedic Surgery

## 2022-06-23 ENCOUNTER — Other Ambulatory Visit: Payer: Self-pay | Admitting: Orthopedic Surgery

## 2022-06-23 DIAGNOSIS — R600 Localized edema: Secondary | ICD-10-CM | POA: Diagnosis present

## 2022-06-23 DIAGNOSIS — R2241 Localized swelling, mass and lump, right lower limb: Secondary | ICD-10-CM | POA: Insufficient documentation

## 2022-12-06 ENCOUNTER — Telehealth: Payer: Self-pay

## 2022-12-06 NOTE — Telephone Encounter (Signed)
Courtesy call placed to NP. No answer, VM left with appt details and clinic location. CB # left for any questions.

## 2022-12-07 ENCOUNTER — Inpatient Hospital Stay: Payer: 59

## 2022-12-07 ENCOUNTER — Inpatient Hospital Stay: Payer: 59 | Attending: Oncology | Admitting: Oncology

## 2022-12-07 ENCOUNTER — Encounter: Payer: Self-pay | Admitting: Oncology

## 2022-12-07 VITALS — BP 119/78 | HR 78 | Temp 97.3°F | Resp 18 | Ht 70.0 in | Wt 208.9 lb

## 2022-12-07 DIAGNOSIS — D509 Iron deficiency anemia, unspecified: Secondary | ICD-10-CM

## 2022-12-07 DIAGNOSIS — R5383 Other fatigue: Secondary | ICD-10-CM | POA: Insufficient documentation

## 2022-12-07 DIAGNOSIS — K921 Melena: Secondary | ICD-10-CM | POA: Diagnosis not present

## 2022-12-07 DIAGNOSIS — E611 Iron deficiency: Secondary | ICD-10-CM | POA: Insufficient documentation

## 2022-12-07 LAB — CBC WITH DIFFERENTIAL/PLATELET
Abs Immature Granulocytes: 0.01 10*3/uL (ref 0.00–0.07)
Basophils Absolute: 0 10*3/uL (ref 0.0–0.1)
Basophils Relative: 1 %
Eosinophils Absolute: 0.1 10*3/uL (ref 0.0–0.5)
Eosinophils Relative: 2 %
HCT: 44.3 % (ref 36.0–46.0)
Hemoglobin: 15 g/dL (ref 12.0–15.0)
Immature Granulocytes: 0 %
Lymphocytes Relative: 37 %
Lymphs Abs: 2.8 10*3/uL (ref 0.7–4.0)
MCH: 28.3 pg (ref 26.0–34.0)
MCHC: 33.9 g/dL (ref 30.0–36.0)
MCV: 83.6 fL (ref 80.0–100.0)
Monocytes Absolute: 0.5 10*3/uL (ref 0.1–1.0)
Monocytes Relative: 7 %
Neutro Abs: 4.2 10*3/uL (ref 1.7–7.7)
Neutrophils Relative %: 53 %
Platelets: 289 10*3/uL (ref 150–400)
RBC: 5.3 MIL/uL — ABNORMAL HIGH (ref 3.87–5.11)
RDW: 13.3 % (ref 11.5–15.5)
WBC: 7.7 10*3/uL (ref 4.0–10.5)
nRBC: 0 % (ref 0.0–0.2)

## 2022-12-07 LAB — RETIC PANEL
Immature Retic Fract: 5.2 % (ref 2.3–15.9)
RBC.: 5.23 MIL/uL — ABNORMAL HIGH (ref 3.87–5.11)
Retic Count, Absolute: 64.3 10*3/uL (ref 19.0–186.0)
Retic Ct Pct: 1.2 % (ref 0.4–3.1)
Reticulocyte Hemoglobin: 32.2 pg (ref >27.9)

## 2022-12-07 LAB — IRON AND TIBC
Iron: 62 ug/dL (ref 28–170)
Saturation Ratios: 16 % (ref 10.4–31.8)
TIBC: 388 ug/dL (ref 250–450)
UIBC: 326 ug/dL

## 2022-12-07 LAB — FERRITIN: Ferritin: 14 ng/mL (ref 11–307)

## 2022-12-07 NOTE — Assessment & Plan Note (Addendum)
Fatigue not explained by iron deficiency as hemoglobin levels are normal. Differential diagnosis: Possible, thyroid disorder, mood disorders, or sleep apnea contribution, though patient reports feeling refreshed in the morning and uses CPAP machine effectively. Pending above work up

## 2022-12-07 NOTE — Progress Notes (Signed)
Hematology/Oncology Consult note [phone] [phone]      Patient Care Team: Donnamarie Rossetti, PA-C as PCP - General (Family Medicine)   REFERRING PROVIDER: Grayland Ormond Hestle,*  CHIEF COMPLAINTS/REASON FOR VISIT:  Low ferritin   ASSESSMENT & PLAN:  Iron deficiency Iron Deficiency, persistent low iron levels without anemia; hemoglobin level is normal at 14. Repeat cbc, iron tibc ferritin, retic panel, smear, epo, Jak2 mutation with reflex, testosterone level  She may try different oral iron formulations. IV iron supplementation deemed unnecessary at this time due to normal hemoglobin levels and potential risks.   Fatigue Fatigue not explained by iron deficiency as hemoglobin levels are normal. Differential diagnosis: Possible, thyroid disorder, mood disorders, or sleep apnea contribution, though patient reports feeling refreshed in the morning and uses CPAP machine effectively. Pending above work up  Orders Placed This Encounter  Procedures   Ferritin    Standing Status:   Future    Number of Occurrences:   1    Standing Expiration Date:   06/08/2023   Iron and TIBC    Standing Status:   Future    Number of Occurrences:   1    Standing Expiration Date:   12/07/2023   Retic Panel    Standing Status:   Future    Number of Occurrences:   1    Standing Expiration Date:   12/07/2023   CBC with Differential/Platelet    Standing Status:   Future    Number of Occurrences:   1    Standing Expiration Date:   12/07/2023   JAK2 V617F rfx CALR/MPL/E12-15    Standing Status:   Future    Number of Occurrences:   1    Standing Expiration Date:   12/07/2023   Testosterone    Standing Status:   Future    Number of Occurrences:   1    Standing Expiration Date:   12/07/2023   Technologist smear review    Standing Status:   Future    Number of Occurrences:   1    Standing Expiration Date:   12/07/2023    Order Specific Question:   Clinical information:     Answer:   iron deficiency   Erythropoietin    Standing Status:   Future    Number of Occurrences:   1    Standing Expiration Date:   12/07/2023   Carbon Monoxide, Blood    Standing Status:   Future    Number of Occurrences:   1    Standing Expiration Date:   12/07/2023   Follow up in 3-4 weeks.  All questions were answered. The patient knows to call the clinic with any problems, questions or concerns.  Earlie Server, MD, PhD [facility] Hematology Oncology 12/07/2022     HISTORY OF PRESENTING ILLNESS:  Diamond Hernandez is a  37 y.o.  female with PMH listed below who was referred to me for low ferritin  Reviewed patient's recent labs that was done.   Patient reports a history of low iron levels for "probably a little over a year." Recent blood work showed 11/28/22 Iron saturation 12, ferritin 8, she has normal hemoglobin of 14, MCV 83.5 Review previous blood work. 10/27/21 Hb 12.1, MCV 74.6, 12/27/21 ferritin 4, iron saturation 5% - Experiences nausea from oral iron supplementation: "I take the iron, and I hate the iron. It makes me so sick." - Reports occasional blood in stool but not consistently: "Sometimes. Not always, though."  She reports normal EGD and Colonoscopy, results are not available to me in EMR - Denies smoking cigarettes and has sleep apnea, for which she uses CPAP machine. Reports being complaint with CPAP - Expresses feeling tired despite normal hemoglobin levels:     MEDICAL HISTORY:  Past Medical History:  Diagnosis Date   Bilateral ovarian cysts    Diabetes mellitus without complication    Endometriosis    Fibroid    PCOS (polycystic ovarian syndrome)    Thyroid disease     SURGICAL HISTORY: Past Surgical History:  Procedure Laterality Date   APPENDECTOMY     CHOLECYSTECTOMY     LAPAROSCOPIC OVARIAN CYSTECTOMY      SOCIAL HISTORY: Social History   Socioeconomic History   Marital status: Single    Spouse name: Not on file   Number of children: Not on file    Years of education: Not on file   Highest education level: Not on file  Occupational History   Not on file  Tobacco Use   Smoking status: Never   Smokeless tobacco: Never  Vaping Use   Vaping Use: Never used  Substance and Sexual Activity   Alcohol use: Not Currently    Comment: occ   Drug use: Never   Sexual activity: Yes    Birth control/protection: I.U.D.    Comment: Paragard  Other Topics Concern   Not on file  Social History Narrative   Not on file   Social Determinants of Health   Financial Resource Strain: Not on file  Food Insecurity: No Food Insecurity (12/07/2022)   Hunger Vital Sign    Worried About Running Out of Food in the Last Year: Never true    Ran Out of Food in the Last Year: Never true  Transportation Needs: No Transportation Needs (12/07/2022)   PRAPARE - Hydrologist (Medical): No    Lack of Transportation (Non-Medical): No  Physical Activity: Not on file  Stress: Not on file  Social Connections: Not on file  Intimate Partner Violence: Not At Risk (12/07/2022)   Humiliation, Afraid, Rape, and Kick questionnaire    Fear of Current or Ex-Partner: No    Emotionally Abused: No    Physically Abused: No    Sexually Abused: No    FAMILY HISTORY: Family History  Problem Relation Age of Onset   Hashimoto's thyroiditis Mother    Diabetes Father    Colon cancer Maternal Grandmother    Colon cancer Paternal Grandmother     ALLERGIES:  is allergic to ondansetron.  MEDICATIONS:  Current Outpatient Medications  Medication Sig Dispense Refill   Cholecalciferol 50 MCG (2000 UT) TABS Take by mouth.     colestipol (COLESTID) 1 g tablet Take 2 g by mouth 2 (two) times daily.     Continuous Blood Gluc Receiver (FREESTYLE LIBRE 14 DAY READER) DEVI      Continuous Blood Gluc Sensor (FREESTYLE LIBRE 14 DAY SENSOR) MISC SMARTSIG:1 Each Topical Every 2 Weeks     EUTHYROX 75 MCG tablet Take 75 mcg by mouth every morning.     ibuprofen  (ADVIL) 800 MG tablet TAKE 1 TABLET BY MOUTH THREE TIMES A DAY AS NEEDED 30 tablet 1   insulin glargine (LANTUS) 100 UNIT/ML injection INJECT UP TO 100 UNITS SUBCUTANEOUSLY DAILY     levonorgestrel (MIRENA) 20 MCG/DAY IUD 1 each by Intrauterine route once for 1 dose. 1 each 0   levothyroxine (SYNTHROID) 75 MCG tablet Take by mouth.  MOUNJARO 10 MG/0.5ML Pen Inject 10 mg into the skin once a week.     omeprazole (PRILOSEC) 40 MG capsule Take 40 mg by mouth daily.     No current facility-administered medications for this visit.    Review of Systems  Constitutional:  Positive for fatigue. Negative for appetite change, chills and fever.  HENT:   Negative for hearing loss and voice change.   Eyes:  Negative for eye problems.  Respiratory:  Negative for chest tightness and cough.   Cardiovascular:  Negative for chest pain.  Gastrointestinal:  Negative for abdominal distention, abdominal pain and blood in stool.  Endocrine: Negative for hot flashes.  Genitourinary:  Negative for difficulty urinating and frequency.   Musculoskeletal:  Negative for arthralgias.  Skin:  Negative for itching and rash.  Neurological:  Negative for extremity weakness.  Hematological:  Negative for adenopathy.  Psychiatric/Behavioral:  Negative for confusion.     PHYSICAL EXAMINATION: Vitals:   12/07/22 1553  BP: 119/78  Pulse: 78  Resp: 18  Temp: (!) 97.3 F (36.3 C)   Filed Weights   12/07/22 1553  Weight: 208 lb 14.4 oz (94.8 kg)    Physical Exam Constitutional:      General: She is not in acute distress. HENT:     Head: Normocephalic and atraumatic.  Eyes:     General: No scleral icterus. Cardiovascular:     Rate and Rhythm: Normal rate and regular rhythm.     Heart sounds: Normal heart sounds.  Pulmonary:     Effort: Pulmonary effort is normal. No respiratory distress.     Breath sounds: No wheezing.  Abdominal:     General: Bowel sounds are normal. There is no distension.      Palpations: Abdomen is soft.  Musculoskeletal:        General: No deformity. Normal range of motion.     Cervical back: Normal range of motion and neck supple.  Skin:    General: Skin is warm and dry.     Findings: No erythema or rash.  Neurological:     Mental Status: She is alert and oriented to person, place, and time. Mental status is at baseline.     Cranial Nerves: No cranial nerve deficit.     Coordination: Coordination normal.  Psychiatric:        Mood and Affect: Mood normal.      LABORATORY DATA:  I have reviewed the data as listed    Latest Ref Rng & Units 12/07/2022    4:29 PM  CBC  WBC 4.0 - 10.5 K/uL 7.7   Hemoglobin 12.0 - 15.0 g/dL 15.0   Hematocrit 36.0 - 46.0 % 44.3   Platelets 150 - 400 K/uL 289        No data to display            Component Value Date/Time   IRON 62 12/07/2022 1629   TIBC 388 12/07/2022 1629   FERRITIN 14 12/07/2022 1629   IRONPCTSAT 16 12/07/2022 1629     RADIOGRAPHIC STUDIES: I have personally reviewed the radiological images as listed and agreed with the findings in the report. No results found.

## 2022-12-07 NOTE — Assessment & Plan Note (Addendum)
Iron Deficiency, persistent low iron levels without anemia; hemoglobin level is normal at 14. Repeat cbc, iron tibc ferritin, retic panel, smear, epo, Jak2 mutation with reflex, testosterone level  She may try different oral iron formulations. IV iron supplementation deemed unnecessary at this time due to normal hemoglobin levels and potential risks.

## 2022-12-08 LAB — TECHNOLOGIST SMEAR REVIEW

## 2022-12-08 LAB — TESTOSTERONE: Testosterone: 24 ng/dL (ref 8–60)

## 2022-12-08 LAB — CARBON MONOXIDE, BLOOD (PERFORMED AT REF LAB): Carbon Monoxide, Blood: 2.1 % (ref 0.0–3.6)

## 2022-12-08 LAB — ERYTHROPOIETIN: Erythropoietin: 10.4 m[IU]/mL (ref 2.6–18.5)

## 2022-12-11 ENCOUNTER — Encounter: Payer: Self-pay | Admitting: Oncology

## 2022-12-19 LAB — JAK2 V617F RFX CALR/MPL/E12-15

## 2022-12-19 LAB — CALR +MPL + E12-E15  (REFLEX)

## 2023-01-08 ENCOUNTER — Ambulatory Visit: Payer: 59 | Admitting: Oncology

## 2023-01-18 ENCOUNTER — Encounter: Payer: Self-pay | Admitting: Oncology

## 2023-01-18 ENCOUNTER — Inpatient Hospital Stay: Payer: 59 | Attending: Oncology | Admitting: Oncology

## 2023-01-18 VITALS — BP 116/75 | HR 84 | Temp 97.8°F | Resp 18 | Wt 203.7 lb

## 2023-01-18 DIAGNOSIS — D509 Iron deficiency anemia, unspecified: Secondary | ICD-10-CM

## 2023-01-18 DIAGNOSIS — R5383 Other fatigue: Secondary | ICD-10-CM

## 2023-01-18 DIAGNOSIS — E611 Iron deficiency: Secondary | ICD-10-CM | POA: Insufficient documentation

## 2023-01-18 NOTE — Assessment & Plan Note (Addendum)
Fatigue not explained by iron deficiency as hemoglobin levels are normal. Differential diagnosis: Possible, thyroid disorder, mood disorders, or sleep apnea contribution, though patient reports feeling refreshed in the morning and uses CPAP machine effectively. Check vitamin D level

## 2023-01-18 NOTE — Progress Notes (Signed)
Hematology/Oncology Consult note [phone] [phone]      Patient Care Team: Wilford Corner, PA-C as PCP - General (Family Medicine)   REFERRING PROVIDER: Debbra Riding Hestle,*  CHIEF COMPLAINTS/REASON FOR VISIT:  Low ferritin   ASSESSMENT & PLAN:  Iron deficiency Iron Deficiency, persistent low iron levels without anemia; hemoglobin level is normal at 15 Repeat testing showed ferritin of 14, iron saturation 16 within normal limits, low normal end.  Other work up negative for myeloproliferative disorders.  Recommend patient to continue oral iron supplementation daily. Repeat levels in 3 months.    Fatigue Fatigue not explained by iron deficiency as hemoglobin levels are normal. Differential diagnosis: Possible, thyroid disorder, mood disorders, or sleep apnea contribution, though patient reports feeling refreshed in the morning and uses CPAP machine effectively. Check vitamin D level   Orders Placed This Encounter  Procedures   CBC with Differential (Cancer Center Only)    Standing Status:   Future    Standing Expiration Date:   01/18/2024   Iron and TIBC    Standing Status:   Future    Standing Expiration Date:   01/18/2024   Ferritin    Standing Status:   Future    Standing Expiration Date:   01/18/2024   VITAMIN D 25 Hydroxy (Vit-D Deficiency, Fractures)    Standing Status:   Future    Standing Expiration Date:   01/18/2024   Retic Panel    Standing Status:   Future    Standing Expiration Date:   01/18/2024   Follow up in 3 months All questions were answered. The patient knows to call the clinic with any problems, questions or concerns.  Rickard Patience, MD, PhD [facility] Hematology Oncology 01/18/2023     HISTORY OF PRESENTING ILLNESS:  Diamond Hernandez is a  37 y.o.  female with PMH listed below who was referred to me for low ferritin  Reviewed patient's recent labs that was done.   Patient reports a history of low iron levels for  "probably a little over a year." Recent blood work showed 11/28/22 Iron saturation 12, ferritin 8, she has normal hemoglobin of 14, MCV 83.5 Review previous blood work. 10/27/21 Hb 12.1, MCV 74.6, 12/27/21 ferritin 4, iron saturation 5% - Experiences nausea from oral iron supplementation: "I take the iron, and I hate the iron. It makes me so sick." - Reports occasional blood in stool but not consistently: "Sometimes. Not always, though." She reports normal EGD and Colonoscopy, results are not available to me in EMR - Denies smoking cigarettes and has sleep apnea, for which she uses CPAP machine. Reports being complaint with CPAP - Expresses feeling tired despite normal hemoglobin levels:   INTERVAL HISTORY Diamond Hernandez is a 37 y.o. female who has above history reviewed by me today presents for follow up visit for  Low ferritin No new complaints. Chronically fatigued.   MEDICAL HISTORY:  Past Medical History:  Diagnosis Date   Bilateral ovarian cysts    Diabetes mellitus without complication (HCC)    Endometriosis    Fibroid    PCOS (polycystic ovarian syndrome)    Thyroid disease     SURGICAL HISTORY: Past Surgical History:  Procedure Laterality Date   APPENDECTOMY     CHOLECYSTECTOMY     LAPAROSCOPIC OVARIAN CYSTECTOMY      SOCIAL HISTORY: Social History   Socioeconomic History   Marital status: Single    Spouse name: Not on file   Number of children:  Not on file   Years of education: Not on file   Highest education level: Not on file  Occupational History   Not on file  Tobacco Use   Smoking status: Never   Smokeless tobacco: Never  Vaping Use   Vaping Use: Never used  Substance and Sexual Activity   Alcohol use: Not Currently    Comment: occ   Drug use: Never   Sexual activity: Yes    Birth control/protection: I.U.D.    Comment: Paragard  Other Topics Concern   Not on file  Social History Narrative   Not on file   Social Determinants of Health   Financial  Resource Strain: Not on file  Food Insecurity: No Food Insecurity (12/07/2022)   Hunger Vital Sign    Worried About Running Out of Food in the Last Year: Never true    Ran Out of Food in the Last Year: Never true  Transportation Needs: No Transportation Needs (12/07/2022)   PRAPARE - Administrator, Civil Service (Medical): No    Lack of Transportation (Non-Medical): No  Physical Activity: Not on file  Stress: Not on file  Social Connections: Not on file  Intimate Partner Violence: Not At Risk (12/07/2022)   Humiliation, Afraid, Rape, and Kick questionnaire    Fear of Current or Ex-Partner: No    Emotionally Abused: No    Physically Abused: No    Sexually Abused: No    FAMILY HISTORY: Family History  Problem Relation Age of Onset   Hashimoto's thyroiditis Mother    Diabetes Father    Colon cancer Maternal Grandmother    Colon cancer Paternal Grandmother     ALLERGIES:  is allergic to ondansetron.  MEDICATIONS:  Current Outpatient Medications  Medication Sig Dispense Refill   Cholecalciferol 50 MCG (2000 UT) TABS Take by mouth.     colestipol (COLESTID) 1 g tablet Take 2 g by mouth 2 (two) times daily.     Continuous Blood Gluc Receiver (FREESTYLE LIBRE 14 DAY READER) DEVI      Continuous Blood Gluc Sensor (FREESTYLE LIBRE 14 DAY SENSOR) MISC SMARTSIG:1 Each Topical Every 2 Weeks     ibuprofen (ADVIL) 800 MG tablet TAKE 1 TABLET BY MOUTH THREE TIMES A DAY AS NEEDED 30 tablet 1   insulin glargine (LANTUS) 100 UNIT/ML injection INJECT UP TO 100 UNITS SUBCUTANEOUSLY DAILY     levonorgestrel (MIRENA) 20 MCG/DAY IUD 1 each by Intrauterine route once for 1 dose. 1 each 0   levothyroxine (SYNTHROID) 75 MCG tablet Take by mouth.     MOUNJARO 10 MG/0.5ML Pen Inject 10 mg into the skin once a week.     omeprazole (PRILOSEC) 40 MG capsule Take 40 mg by mouth daily.     EUTHYROX 75 MCG tablet Take 75 mcg by mouth every morning.     No current facility-administered medications  for this visit.    Review of Systems  Constitutional:  Positive for fatigue. Negative for appetite change, chills and fever.  HENT:   Negative for hearing loss and voice change.   Eyes:  Negative for eye problems.  Respiratory:  Negative for chest tightness and cough.   Cardiovascular:  Negative for chest pain.  Gastrointestinal:  Negative for abdominal distention, abdominal pain and blood in stool.  Endocrine: Negative for hot flashes.  Genitourinary:  Negative for difficulty urinating and frequency.   Musculoskeletal:  Negative for arthralgias.  Skin:  Negative for itching and rash.  Neurological:  Negative for  extremity weakness.  Hematological:  Negative for adenopathy.  Psychiatric/Behavioral:  Negative for confusion.     PHYSICAL EXAMINATION: Vitals:   01/18/23 1446  BP: 116/75  Pulse: 84  Resp: 18  Temp: 97.8 F (36.6 C)   Filed Weights   01/18/23 1446  Weight: 203 lb 11.2 oz (92.4 kg)    Physical Exam Constitutional:      General: She is not in acute distress. HENT:     Head: Normocephalic and atraumatic.  Eyes:     General: No scleral icterus. Cardiovascular:     Rate and Rhythm: Normal rate and regular rhythm.     Heart sounds: Normal heart sounds.  Pulmonary:     Effort: Pulmonary effort is normal. No respiratory distress.     Breath sounds: No wheezing.  Abdominal:     General: Bowel sounds are normal. There is no distension.     Palpations: Abdomen is soft.  Musculoskeletal:        General: No deformity. Normal range of motion.     Cervical back: Normal range of motion and neck supple.  Skin:    General: Skin is warm and dry.     Findings: No erythema or rash.  Neurological:     Mental Status: She is alert and oriented to person, place, and time. Mental status is at baseline.     Cranial Nerves: No cranial nerve deficit.     Coordination: Coordination normal.  Psychiatric:        Mood and Affect: Mood normal.      LABORATORY DATA:  I have  reviewed the data as listed    Latest Ref Rng & Units 12/07/2022    4:29 PM  CBC  WBC 4.0 - 10.5 K/uL 7.7   Hemoglobin 12.0 - 15.0 g/dL 86.5   Hematocrit 78.4 - 46.0 % 44.3   Platelets 150 - 400 K/uL 289        No data to display             Component Value Date/Time   IRON 62 12/07/2022 1629   TIBC 388 12/07/2022 1629   FERRITIN 14 12/07/2022 1629   IRONPCTSAT 16 12/07/2022 1629     RADIOGRAPHIC STUDIES: I have personally reviewed the radiological images as listed and agreed with the findings in the report. No results found.

## 2023-01-18 NOTE — Progress Notes (Signed)
Pt here for follow up. Pt reports being "always tired."

## 2023-01-18 NOTE — Assessment & Plan Note (Signed)
Iron Deficiency, persistent low iron levels without anemia; hemoglobin level is normal at 15 Repeat testing showed ferritin of 14, iron saturation 16 within normal limits, low normal end.  Other work up negative for myeloproliferative disorders.  Recommend patient to continue oral iron supplementation daily. Repeat levels in 3 months.

## 2023-04-18 ENCOUNTER — Encounter: Payer: Self-pay | Admitting: Oncology

## 2023-04-19 ENCOUNTER — Inpatient Hospital Stay: Payer: 59 | Attending: Oncology

## 2023-04-19 DIAGNOSIS — D509 Iron deficiency anemia, unspecified: Secondary | ICD-10-CM

## 2023-04-19 DIAGNOSIS — E611 Iron deficiency: Secondary | ICD-10-CM | POA: Diagnosis not present

## 2023-04-19 LAB — RETIC PANEL
Immature Retic Fract: 3.9 % (ref 2.3–15.9)
RBC.: 5.06 MIL/uL (ref 3.87–5.11)
Retic Count, Absolute: 65.8 10*3/uL (ref 19.0–186.0)
Retic Ct Pct: 1.3 % (ref 0.4–3.1)
Reticulocyte Hemoglobin: 31.8 pg (ref >27.9)

## 2023-04-19 LAB — CBC WITH DIFFERENTIAL (CANCER CENTER ONLY)
Abs Immature Granulocytes: 0.05 10*3/uL (ref 0.00–0.07)
Basophils Absolute: 0 10*3/uL (ref 0.0–0.1)
Basophils Relative: 1 %
Eosinophils Absolute: 0.1 10*3/uL (ref 0.0–0.5)
Eosinophils Relative: 2 %
HCT: 42.3 % (ref 36.0–46.0)
Hemoglobin: 14.5 g/dL (ref 12.0–15.0)
Immature Granulocytes: 1 %
Lymphocytes Relative: 40 %
Lymphs Abs: 3.3 10*3/uL (ref 0.7–4.0)
MCH: 29 pg (ref 26.0–34.0)
MCHC: 34.3 g/dL (ref 30.0–36.0)
MCV: 84.6 fL (ref 80.0–100.0)
Monocytes Absolute: 0.6 10*3/uL (ref 0.1–1.0)
Monocytes Relative: 7 %
Neutro Abs: 4.1 10*3/uL (ref 1.7–7.7)
Neutrophils Relative %: 49 %
Platelet Count: 308 10*3/uL (ref 150–400)
RBC: 5 MIL/uL (ref 3.87–5.11)
RDW: 13.1 % (ref 11.5–15.5)
WBC Count: 8.2 10*3/uL (ref 4.0–10.5)
nRBC: 0 % (ref 0.0–0.2)

## 2023-04-19 LAB — IRON AND TIBC
Iron: 90 ug/dL (ref 28–170)
Saturation Ratios: 25 % (ref 10.4–31.8)
TIBC: 363 ug/dL (ref 250–450)
UIBC: 273 ug/dL

## 2023-04-19 LAB — VITAMIN D 25 HYDROXY (VIT D DEFICIENCY, FRACTURES): Vit D, 25-Hydroxy: 63.26 ng/mL (ref 30–100)

## 2023-04-19 LAB — FERRITIN: Ferritin: 11 ng/mL (ref 11–307)

## 2023-04-24 ENCOUNTER — Inpatient Hospital Stay: Payer: 59 | Admitting: Oncology

## 2023-05-01 ENCOUNTER — Inpatient Hospital Stay: Payer: 59 | Admitting: Oncology

## 2023-05-23 ENCOUNTER — Inpatient Hospital Stay: Payer: 59 | Admitting: Oncology

## 2023-06-04 ENCOUNTER — Inpatient Hospital Stay: Payer: 59 | Admitting: Oncology

## 2023-06-04 NOTE — Assessment & Plan Note (Deleted)
Lab Results  Component Value Date   HGB 14.5 04/19/2023   TIBC 363 04/19/2023   IRONPCTSAT 25 04/19/2023   FERRITIN 11 04/19/2023      Recommend patient to continue oral iron supplementation daily. Repeat levels in 3 months.
